# Patient Record
Sex: Female | Born: 1990 | Race: Black or African American | Hispanic: No | Marital: Single | Smoking: Current every day smoker
Health system: Southern US, Community
[De-identification: ages and names within clinical notes are randomized; demographics above are authoritative.]

## PROBLEM LIST (undated history)

## (undated) DIAGNOSIS — J45909 Unspecified asthma, uncomplicated: Secondary | ICD-10-CM

---

## 2015-12-21 ENCOUNTER — Emergency Department (HOSPITAL_COMMUNITY)
Admission: EM | Admit: 2015-12-21 | Discharge: 2015-12-21 | Disposition: A | Discharge status: Home / Self Care | Payer: Self-pay | Attending: Emergency Medicine | Admitting: Emergency Medicine

## 2015-12-21 ENCOUNTER — Emergency Department (HOSPITAL_COMMUNITY): Payer: Self-pay

## 2015-12-21 ENCOUNTER — Encounter (HOSPITAL_COMMUNITY): Payer: Self-pay | Admitting: Emergency Medicine

## 2015-12-21 DIAGNOSIS — Y9301 Activity, walking, marching and hiking: Secondary | ICD-10-CM | POA: Insufficient documentation

## 2015-12-21 DIAGNOSIS — Y998 Other external cause status: Secondary | ICD-10-CM | POA: Insufficient documentation

## 2015-12-21 DIAGNOSIS — F17210 Nicotine dependence, cigarettes, uncomplicated: Secondary | ICD-10-CM | POA: Insufficient documentation

## 2015-12-21 DIAGNOSIS — W010XXA Fall on same level from slipping, tripping and stumbling without subsequent striking against object, initial encounter: Secondary | ICD-10-CM | POA: Insufficient documentation

## 2015-12-21 DIAGNOSIS — S43014A Anterior dislocation of right humerus, initial encounter: Secondary | ICD-10-CM | POA: Insufficient documentation

## 2015-12-21 DIAGNOSIS — Y9289 Other specified places as the place of occurrence of the external cause: Secondary | ICD-10-CM | POA: Insufficient documentation

## 2015-12-21 DIAGNOSIS — S43004A Unspecified dislocation of right shoulder joint, initial encounter: Secondary | ICD-10-CM

## 2015-12-21 DIAGNOSIS — J45909 Unspecified asthma, uncomplicated: Secondary | ICD-10-CM | POA: Insufficient documentation

## 2015-12-21 HISTORY — DX: Unspecified asthma, uncomplicated: J45.909

## 2015-12-21 MED ORDER — NAPROXEN 500 MG PO TABS: 500.0000 mg | ORAL_TABLET | Freq: Two times a day (BID) | ORAL | Status: AC

## 2015-12-21 MED ORDER — HYDROMORPHONE HCL 1 MG/ML IJ SOLN
1.0000 mg | Freq: Once | INTRAMUSCULAR | Status: AC
  Administered 2015-12-21: 1 mg via INTRAVENOUS
  Filled 2015-12-21: qty 1

## 2015-12-21 NOTE — ED Notes (Signed)
Per EMS pt c/o right shoulder pain and right rib cage pain onset today after slipping and falling forward and grabbing railing with right arm, at which point she felt "popping" sensation to shoulder. No neck or back pain to palpation. No point tenderness to wrist, hand, elbow, humerus, scapula. EMS administered 250 mcg Fentanyl and 4 mg Zofran en route. History of shoulder dislocations.

## 2015-12-21 NOTE — Progress Notes (Signed)
Pt with a [place] address but [place] in [place] and will take resources also for [place]  CM spoke with pt who confirms uninsured [place]cp.  CM discussed and provided written information for uninsured accepting pcps, discussed the importance of pcp vs EDP services for f/u care, www.needymeds.org, www.goodrx.com, discounted pharmacies and other [place] resources such as [facility] , [facility], affordable care act, financial assistance, uninsured dental services, [place] med assist, DSS and  health department  Reviewed resources for [place] uninsured accepting pcps like [facility], [facility] medicine [facility] [address], [facility], [facility], local urgent care centers, [facility], [facility] family [facility], general medical clinics, [facility], [facility] urgent care plus others, medication resources, [facility] out patient pharmacies and housing Pt voiced understanding and appreciation of resources provided   Provided [facility] contact information Pt is not a [facility] candidate at this time  [place] uninsured resources provided includes [facility] [place] [facility] , [facility] etc

## 2015-12-21 NOTE — Discharge Instructions (Signed)
Shoulder Dislocation Your shoulder joint is made up of 3 bones:  The upper arm bone (humerus).  The shoulder blade (scapula).  The collarbone (clavicle). A shoulder dislocation happens when your upper arm bone moves out of its normal place in your shoulder joint. HOME CARE If You Have a Splint or Sling:  Wear it as told by your doctor.  Take it off only as told by your doctor.  Loosen it if:  Your fingers become numb and tingly.  Your fingers turn cold and blue.  Keep it clean and dry. Bathing  Do not take baths, swim, or use a hot tub until your doctor says you can. Ask your doctor if you can take showers. You may only be allowed to take sponge baths.  If your doctor says taking baths or showers is okay, cover your splint or sling with a plastic bag. Do not let the splint or sling get wet. Managing Pain, Stiffness, and Swelling  If told, put ice on the injured area.  Put ice in a plastic bag.  Place a towel between your skin and the bag.  Leave the ice on for 20 minutes, 2-3 times per day.  Move your fingers often to avoid stiffness and to lessen swelling.  Raise (elevate) the injured area above the level of your heart while you are sitting or lying down. Driving  Do not drive while you are wearing a splint or sling on a hand that you use for driving.  Do not drive or operate heavy machinery while taking pain medicine. Activity  Return to your normal activities as told by your doctor. Ask your doctor what activities are safe for you.  Do range-of-motion exercises only as told by your doctor.  Exercise your hand by squeezing a soft ball. This keeps your hand and wrist from getting stiff and swollen. General Instructions  Take over-the-counter and prescription medicines only as told by your doctor.  Do not use any tobacco products, including cigarettes, chewing tobacco, or e-cigarettes. Tobacco can slow down healing. If you need help quitting, ask your  doctor.  Keep all follow-up visits as told by your doctor. This is important. GET HELP IF:  Your splint or sling gets damaged. GET HELP RIGHT AWAY IF:  Your pain gets worse instead of better.  You lose feeling in your arm or hand.  Your arm or hand turns white and cold.   This information is not intended to replace advice given to you by your health care provider. Make sure you discuss any questions you have with your health care provider.   Document Released: 12/15/2011 Document Revised: 06/13/2015 Document Reviewed: 01/15/2015 Elsevier Interactive Patient Education 2016 [employer].  How to Use a Shoulder Immobilizer A shoulder immobilizer is a device that you may have to wear after a shoulder injury or surgery. This device keeps your arm from moving. This prevents additional pain or injury. It also supports your arm next to your body as your shoulder heals. You may need to wear a shoulder immobilizer to treat a broken bone (fracture) in your shoulder. You may also need to wear one if you have an injury that moves your shoulder out of position (dislocation). There are different types of shoulder immobilizers. The one that you get depends on your injury. RISKS AND COMPLICATIONS Wearing a shoulder immobilizer in the wrong way can let your injured shoulder move around too much. This may delay healing and make your pain and swelling worse. HOW TO USE  YOUR SHOULDER IMMOBILIZER °· The part of the immobilizer that goes around your neck (sling) should support your upper arm, with your elbow bent and your lower arm and hand across your chest. °· Make sure that your elbow: °¨ Is snug against the back pocket of the sling. °¨ Does not move away from your body. °· The strap of the immobilizer should go over your shoulder and support your arm and hand. Your hand should be slightly higher than your elbow. It should not hang loosely over the edge of the sling. °· If the long strap has a pad, place it  where it is most comfortable on your neck. °· Carefully follow your health care provider's instructions for wearing your shoulder immobilizer. Your health care provider may want you to: °¨ Loosen your immobilizer to straighten your elbow and move your wrist and fingers. You may have to do this several times each day. Ask your health care provider when you should do this and how often. °¨ Remove your immobilizer once every day to shower, but limit the movement in your injured arm. Before putting the immobilizer back on, use a towel to dry the area under your arm completely. °¨ Remove your immobilizer to do shoulder exercises at home as directed by your health care provider. °¨ Wear your immobilizer while you sleep. You may sleep more comfortably if you have your upper body raised on pillows. °SEEK MEDICAL CARE IF: °· Your immobilizer is not supporting your arm properly. °· Your immobilizer gets damaged. °· You have worsening pain or swelling in your shoulder, arm, or hand. °· Your shoulder, arm, or hand changes color or temperature. °· You lose feeling in your shoulder, arm, or hand. °  °This information is not intended to replace advice given to you by your health care provider. Make sure you discuss any questions you have with your health care provider. °  °Document Released: 10/30/2004 Document Revised: 02/06/2015 Document Reviewed: 08/30/2014 °Elsevier Interactive Patient Education ©2016 Elsevier Inc. ° °

## 2015-12-21 NOTE — ED Provider Notes (Signed)
CSN: [id number]     Arrival date & time 12/21/15  1406 History   First MD Initiated Contact with Patient 12/21/15 1508     Chief Complaint  Patient presents with  . Shoulder Injury    HPI Comments: Pt was walking and slipped.  She started to fall forward and reached out with her right arm to grab onto a railing.  She felt a pop in her right shoulder and felt like it dislocated.  She has dislocated her shoulder before.  EMS brought her to the ED and she was given fentanyl with some improvement.  While in the ED she had an xray.  After that was performed she was moving around and felt her shoulder pop back into place. She is still having pain but does not feel that it is dislocated now.  No other injuries other than some lower back discomfort.  Patient is a 25 y.o. female presenting with shoulder injury. The history is provided by the patient.  Shoulder Injury This is a new problem. The current episode started 1 to 2 hours ago. The problem occurs constantly. Pertinent negatives include no chest pain, no abdominal pain, no headaches and no shortness of breath. Exacerbated by: movement. Nothing relieves the symptoms.     Past Medical History  Diagnosis Date  . Asthma    History reviewed. No pertinent past surgical history. History reviewed. No pertinent family history. Social History  Substance Use Topics  . Smoking status: Current Every Day Smoker -- 1.00 packs/day    Types: Cigarettes  . Smokeless tobacco: None  . Alcohol Use: No   OB History    No data available     Review of Systems  Respiratory: Negative for shortness of breath.   Cardiovascular: Negative for chest pain.  Gastrointestinal: Negative for abdominal pain.  Neurological: Negative for headaches.  All other systems reviewed and are negative.     Allergies  Review of patient's allergies indicates no known allergies.  Home Medications   Prior to Admission medications   Medication Sig Start Date End Date Taking?  Authorizing Provider  naproxen (NAPROSYN) 500 MG tablet Take 1 tablet (500 mg total) by mouth 2 (two) times daily. 12/21/15   Linwood Dibbles, MD   BP 128/70 mmHg  Pulse 74  Temp(Src) 98.2 F (36.8 C) (Oral)  Resp 16  SpO2 98%  LMP 12/19/2015 Physical Exam  Constitutional: She appears well-developed and well-nourished. No distress.  HENT:  Head: Normocephalic and atraumatic.  Right Ear: External ear normal.  Left Ear: External ear normal.  Eyes: Conjunctivae are normal. Right eye exhibits no discharge. Left eye exhibits no discharge. No scleral icterus.  Neck: Neck supple. No tracheal deviation present.  Cardiovascular: Normal rate.   Pulmonary/Chest: Effort normal. No stridor. No respiratory distress.  Musculoskeletal: She exhibits no edema.       Right shoulder: She exhibits tenderness. She exhibits no bony tenderness, no swelling, no effusion, no crepitus, no deformity and normal strength. Decreased range of motion: able to abduct her shoulder.       Cervical back: Normal.       Thoracic back: She exhibits no tenderness and no bony tenderness.       Lumbar back: She exhibits no tenderness and no bony tenderness.  Neurological: She is alert. Cranial nerve deficit: no gross deficits.  Skin: Skin is warm and dry. No rash noted.  Psychiatric: She has a normal mood and affect.  Nursing note and vitals reviewed.   ED  Course  Procedures (including critical care time) Labs Review Labs Reviewed - No data to display  Imaging Review Dg Shoulder Right  12/21/2015  CLINICAL DATA:  Post reduction RIGHT shoulder EXAM: RIGHT SHOULDER - 2+ VIEW COMPARISON:  Exam at 1546 hours compared to earlier study of 12/21/2015 FINDINGS: Previously identified RIGHT glenohumeral dislocation is now reduced. Osseous mineralization normal. No fracture or bone destruction. AC joint alignment normal. IMPRESSION: Reduction of previously identified RIGHT glenohumeral dislocation. Electronically Signed   By: Ulyses SouthwardMark  Boles  M.D.   On: 12/21/2015 16:01   Dg Shoulder Right  12/21/2015  CLINICAL DATA:  Pain following fall EXAM: RIGHT SHOULDER - 2+ VIEW COMPARISON:  None. FINDINGS: Frontal and Y scapular images were obtained. There is a subcoracoid anterior dislocation. There is a Hill-Sachs defect. No acute fracture evident. No appreciable arthropathic change. Visualized right lung is clear. IMPRESSION: Subcoracoid anterior dislocation. Chronic appearing Hill-Sachs defect. Electronically Signed   By: Bretta BangWilliam  Woodruff III M.D.   On: 12/21/2015 15:03   I have personally reviewed and evaluated these images and lab results as part of my medical decision-making.    MDM   Final diagnoses:  Shoulder dislocation, right, initial encounter    Pt first xray showed a shoulder dislocation.  While in the ED after the first xray she felt her shoulder pop back into place.  Repeat xray does not show a dislocation . Pt was placed in a sling. Follow up with an orthopedist.  Rx for NSAIDS.    Linwood DibblesJon Collette Pescador, MD 12/21/15 [phone]

## 2015-12-21 NOTE — ED Notes (Signed)
Bed: [facility] Expected date:  Expected time:  Means of arrival:  Comments: 

## 2018-03-16 IMAGING — CR DG SHOULDER 2+V*R*
2 series · 2 of 2 positions shown · non-contrast
Comparison: Exam at 5549 hours compared to earlier study of
12/21/2015

CLINICAL DATA: Post reduction RIGHT shoulder

EXAM:
RIGHT SHOULDER - 2+ VIEW

[x shoulder ap right (1 of 2)]
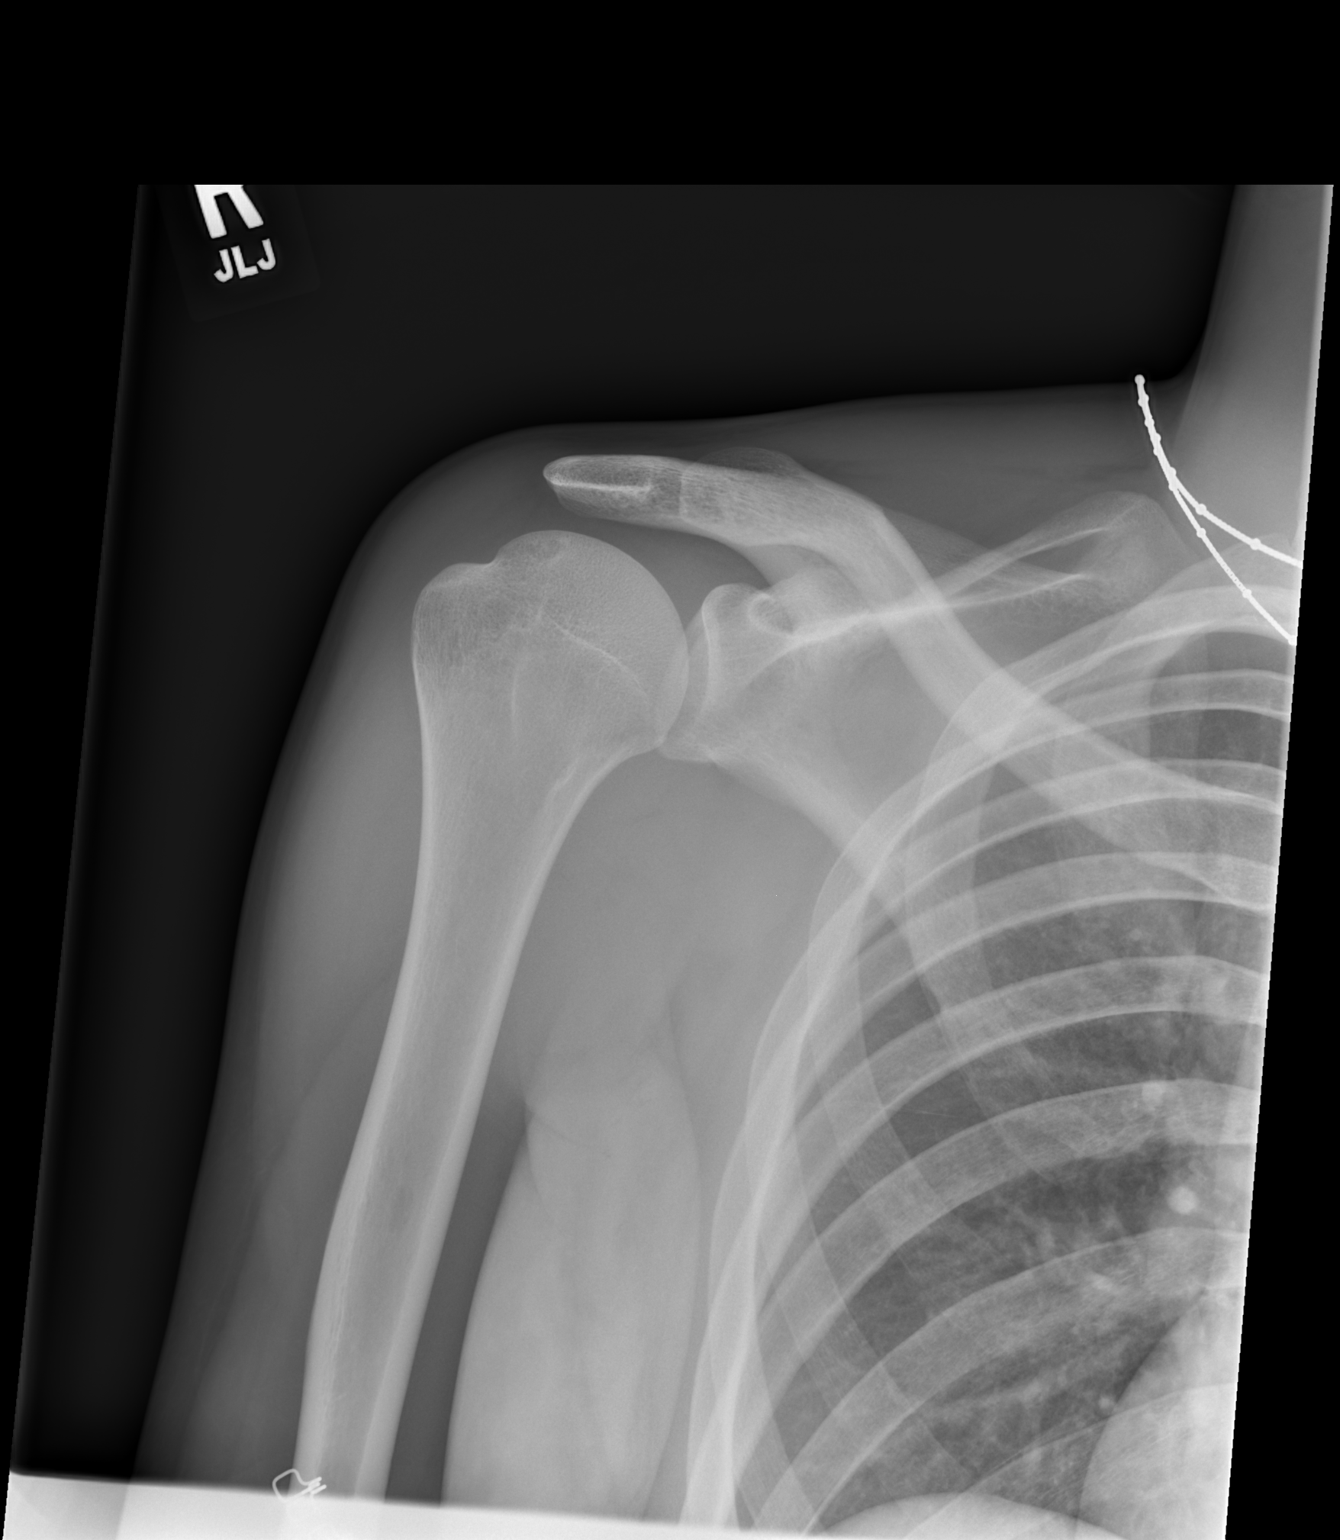

[x shoulder ap right (2 of 2)]
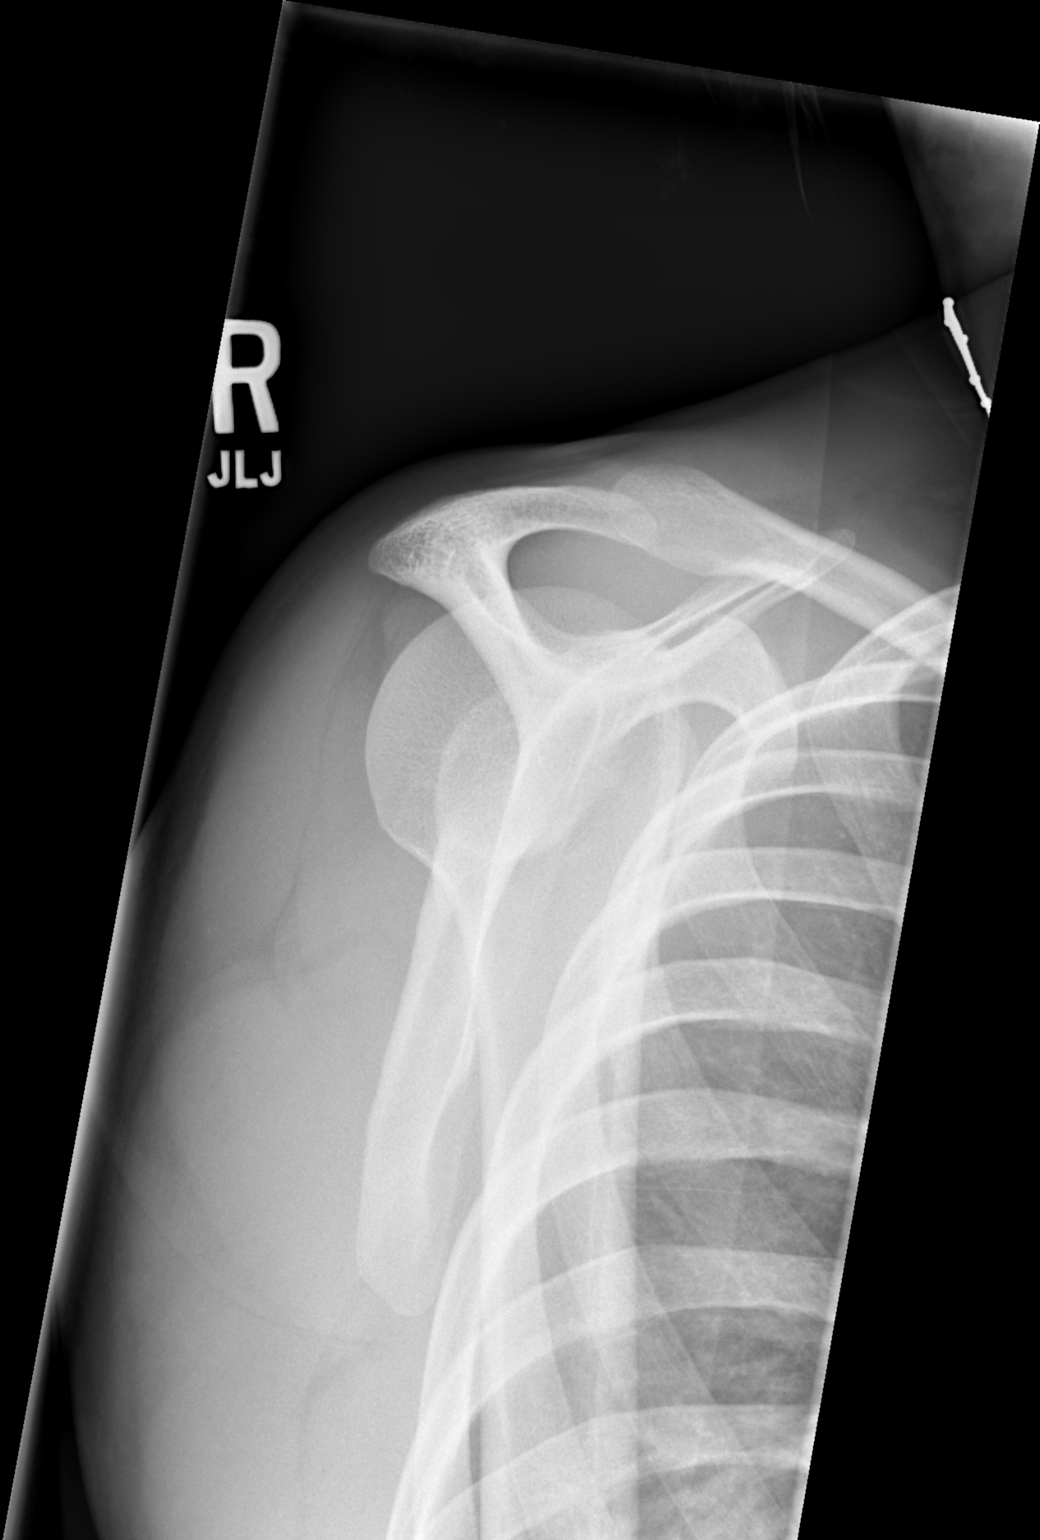

[2 of 2 positions shown; findings below may reference images not displayed]

FINDINGS: Previously identified RIGHT glenohumeral dislocation is now reduced.

Osseous mineralization normal.

No fracture or bone destruction.

AC joint alignment normal.
IMPRESSION: Reduction of previously identified RIGHT glenohumeral dislocation.

## 2018-03-16 IMAGING — CR DG SHOULDER 2+V*R*
3 series · 3 of 3 positions shown · non-contrast
Comparison: None.

CLINICAL DATA: Pain following fall

EXAM:
RIGHT SHOULDER - 2+ VIEW

[w shoulder external right]
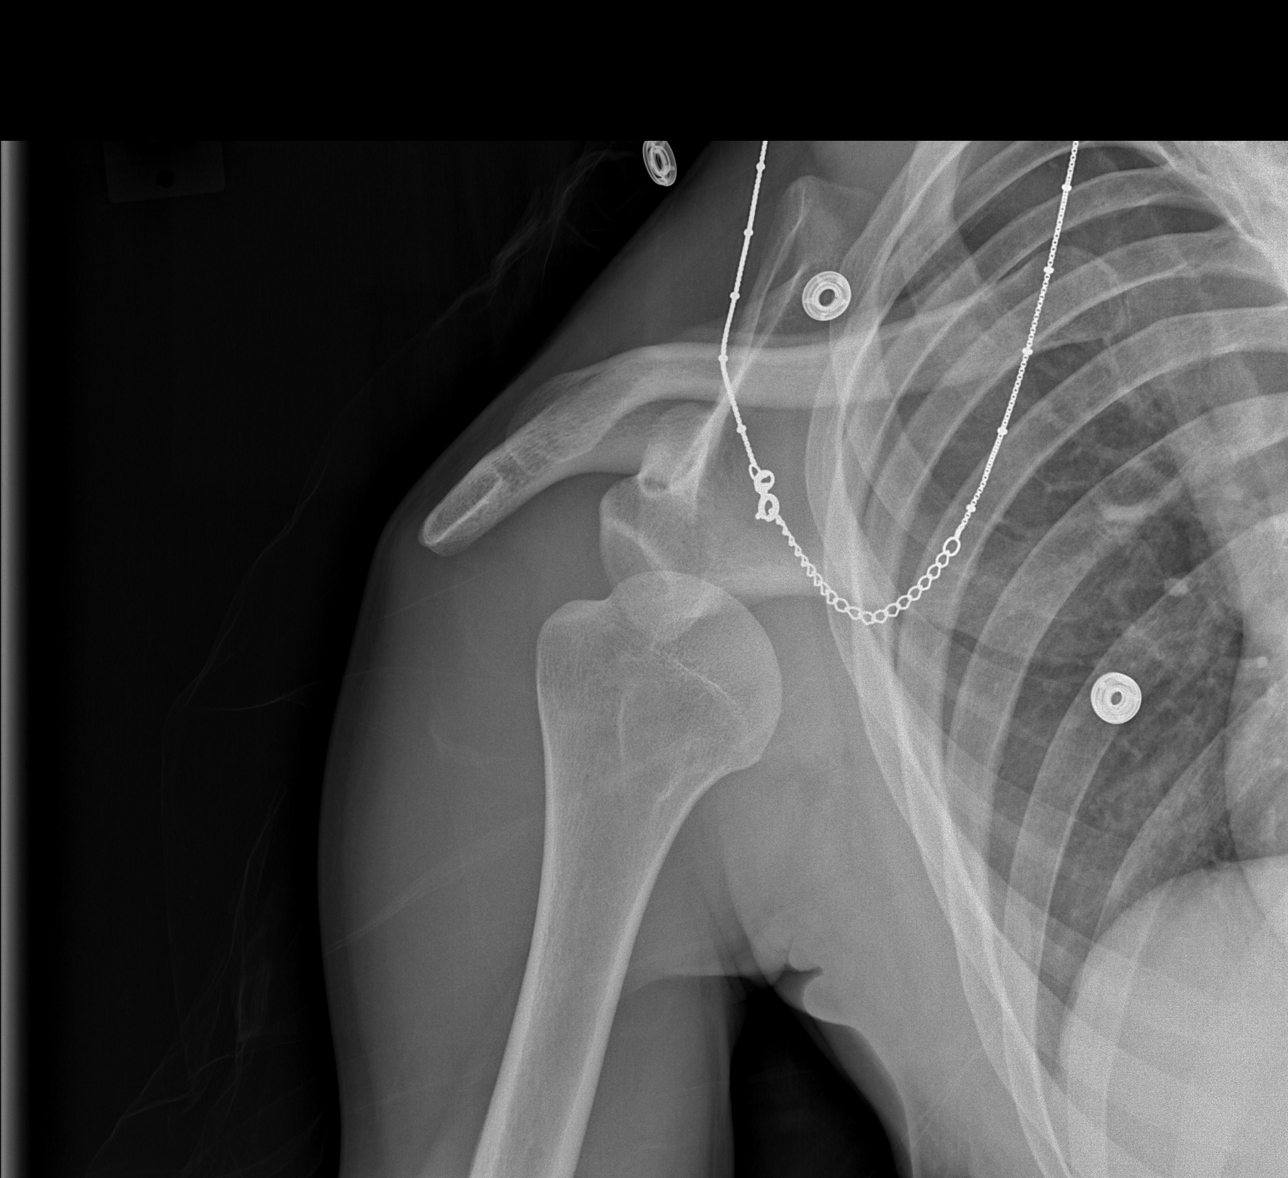

[w shoulder y-view right (1 of 2)]
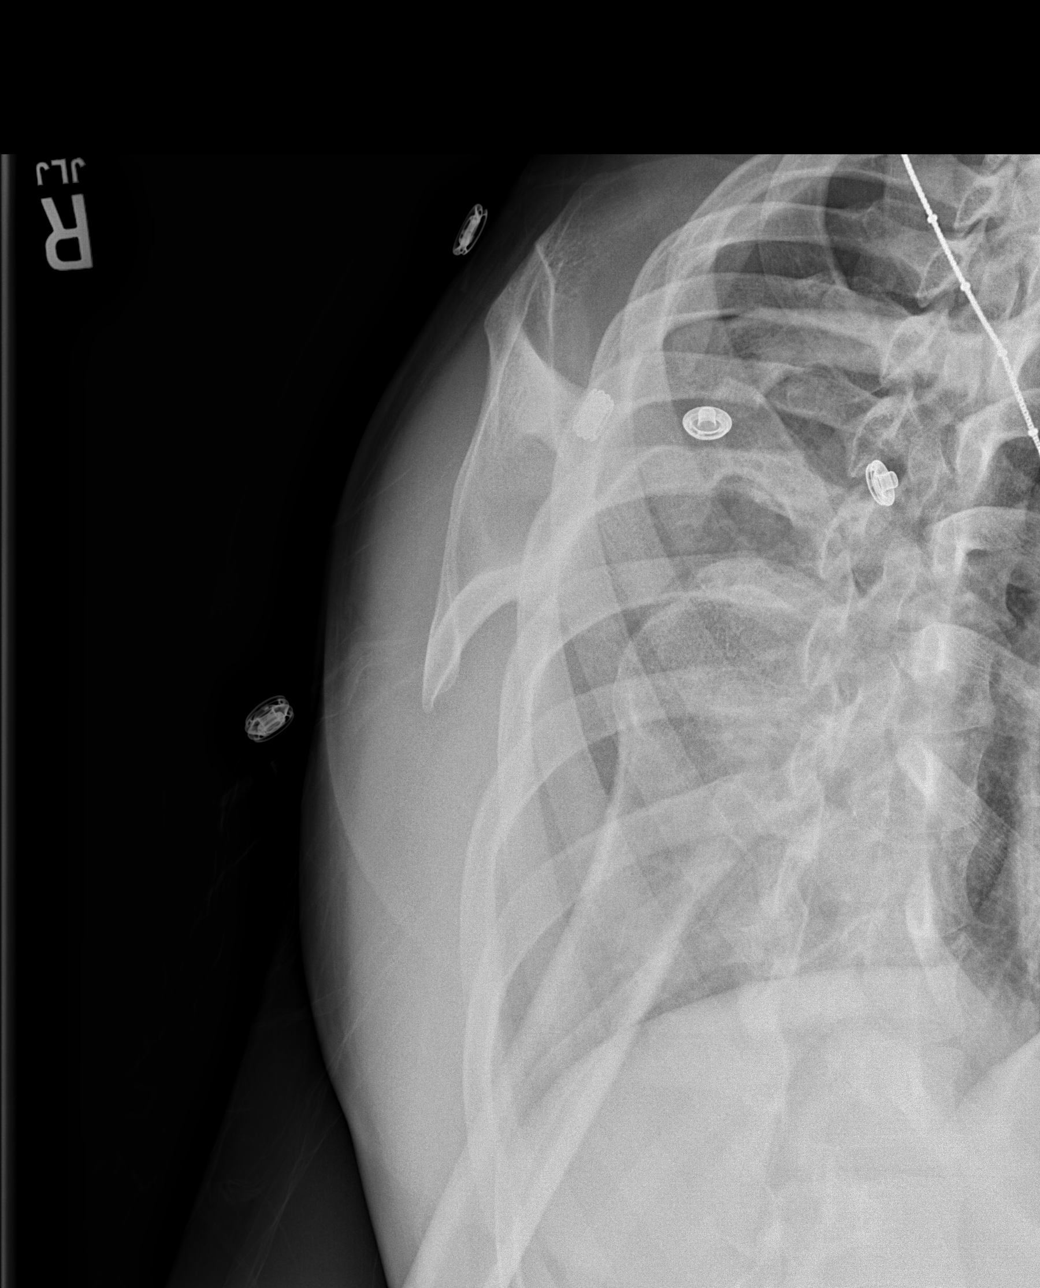

[w shoulder y-view right (2 of 2)]
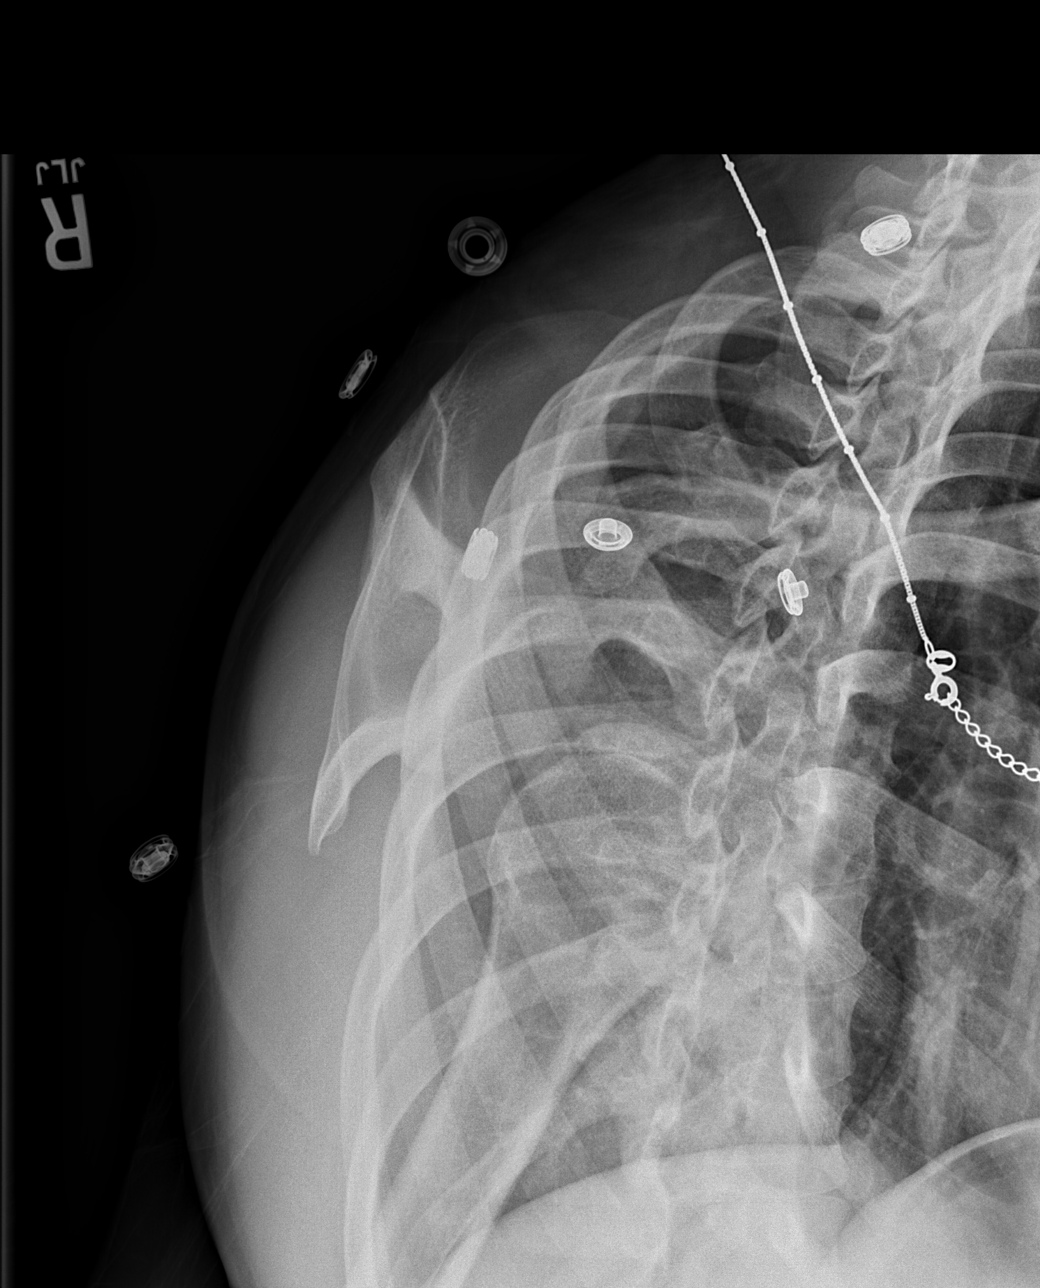

[3 of 3 positions shown; findings below may reference images not displayed]

FINDINGS: Frontal and Y scapular images were obtained. There is a subcoracoid
anterior dislocation. There is a Hill-Sachs defect. No acute
fracture evident. No appreciable arthropathic change. Visualized
right lung is clear.
IMPRESSION: Subcoracoid anterior dislocation. Chronic appearing Hill-Sachs
defect.
